# Patient Record
Sex: Female | Born: 1995 | Race: White | Hispanic: No | Marital: Single | Smoking: Never smoker
Health system: Southern US, Community
[De-identification: ages and names within clinical notes are randomized; demographics above are authoritative.]

## PROBLEM LIST (undated history)

## (undated) DIAGNOSIS — F329 Major depressive disorder, single episode, unspecified: Secondary | ICD-10-CM

## (undated) DIAGNOSIS — F419 Anxiety disorder, unspecified: Secondary | ICD-10-CM

## (undated) DIAGNOSIS — F32A Depression, unspecified: Secondary | ICD-10-CM

## (undated) HISTORY — DX: Anxiety disorder, unspecified: F41.9

## (undated) HISTORY — PX: NO PAST SURGERIES: SHX2092

## (undated) HISTORY — DX: Depression, unspecified: F32.A

## (undated) HISTORY — DX: Major depressive disorder, single episode, unspecified: F32.9

---
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
## 2013-02-10 ENCOUNTER — Ambulatory Visit (HOSPITAL_COMMUNITY): Payer: Self-pay | Admitting: Psychiatry

## 2013-02-11 ENCOUNTER — Encounter (HOSPITAL_COMMUNITY): Payer: Self-pay | Admitting: Psychiatry

## 2013-02-22 ENCOUNTER — Encounter (HOSPITAL_COMMUNITY): Payer: Self-pay

## 2013-02-22 ENCOUNTER — Inpatient Hospital Stay (HOSPITAL_COMMUNITY)
Admission: AD | Admit: 2013-02-22 | Discharge: 2013-02-22 | Disposition: A | Discharge status: Home / Self Care | Payer: Self-pay | Source: Ambulatory Visit | Attending: Obstetrics & Gynecology | Admitting: Obstetrics & Gynecology

## 2013-02-22 DIAGNOSIS — N949 Unspecified condition associated with female genital organs and menstrual cycle: Secondary | ICD-10-CM | POA: Insufficient documentation

## 2013-02-22 DIAGNOSIS — T192XXA Foreign body in vulva and vagina, initial encounter: Secondary | ICD-10-CM | POA: Insufficient documentation

## 2013-02-22 MED ORDER — IBUPROFEN 400 MG PO TABS
400.0000 mg | ORAL_TABLET | Freq: Once | ORAL | Status: AC
  Administered 2013-02-22: 400 mg via ORAL
  Filled 2013-02-22: qty 1

## 2013-02-22 MED ORDER — ALPRAZOLAM 0.25 MG PO TABS
0.2500 mg | ORAL_TABLET | Freq: Once | ORAL | Status: AC
  Administered 2013-02-22: 0.25 mg via ORAL
  Filled 2013-02-22: qty 1

## 2013-02-22 MED ORDER — LIDOCAINE HCL 2 % EX GEL
Freq: Once | CUTANEOUS | Status: DC
Start: 2013-02-22 — End: 2013-02-23
  Filled 2013-02-22: qty 20

## 2013-02-22 NOTE — MAU Note (Signed)
Pt refuses exam

## 2013-02-22 NOTE — MAU Note (Signed)
Denies pain, notes very foul odor. Pt's mother had her wait until menstrual cycle was over to come in to be evaluated.

## 2013-02-22 NOTE — MAU Provider Note (Signed)
History     CSN: [id number]  Arrival date and time: 02/22/13 1620   First Provider Initiated Contact with Patient 02/22/13 1833      Chief Complaint  Patient presents with  . Foreign Body in Vagina   HPI Jamie Castillo is 17 y.o. G0P0 presents with tampon in vagina with vaginal odor.  It has been there for 8 days.  She was under the impression that no one would remove it until her period was over.  She is not sexually active.    Past Medical History  Diagnosis Date  . Depression   . Anxiety     Past Surgical History  Procedure Laterality Date  . No past surgeries      Family History  Problem Relation Age of Onset  . Bipolar disorder Mother   . Asthma Brother   . Hypertension Other   . Depression Other   . Anxiety disorder Father   . Depression Maternal Uncle   . Depression Cousin     History  Substance Use Topics  . Smoking status: Never Smoker   . Smokeless tobacco: Never Used  . Alcohol Use: No    Allergies: No Known Allergies  Prescriptions prior to admission  Medication Sig Dispense Refill  . fexofenadine-pseudoephedrine (ALLEGRA-D 24) 180-240 MG per 24 hr tablet Take 1 tablet by mouth daily.  30 tablet  3  . lamoTRIgine (LAMICTAL) 25 MG tablet Take 1 tablet (25 mg total) by mouth daily.  30 tablet  5  . mirtazapine (REMERON) 15 MG tablet Take 1 tablet (15 mg total) by mouth at bedtime.  30 tablet  2    Review of Systems  Constitutional: Negative for fever and chills.  Genitourinary:       Vaginal odor   Physical Exam   Blood pressure 110/71, pulse 98, temperature 97.9 F (36.6 C), temperature source Oral, resp. rate 16, height 5\' 7"  (1.702 Castillo), weight 144 lb 12.8 oz (65.681 kg), last menstrual period 02/13/2013, SpO2 98.00%.  Physical Exam  Constitutional: She is oriented to person, place, and time. She appears well-developed and well-nourished.  HENT:  Head: Normocephalic.  Neck: Normal range of motion.  Respiratory: Effort normal.   Genitourinary:  Vaginal odor.  Tampon seen approximately 2 cm from the introitus.    Neurological: She is alert and oriented to person, place, and time.  Skin: Skin is warm and dry.  Psychiatric: She has a normal mood and affect. Her behavior is normal.    MAU Course  Procedures  MDM I placed the speculum gently into the vagina, opened it slowly.  Patient jumped, pulling her buttocks up into the bed.  The speculum came out.  Patient was upset, refusing another attempt.  I gave her time to regroup.  Had her Mother come in.  I explained the tampon was very close to the opening and that I would try to get it out with my fingers.  After a few minutes she agreed.  The attempt was unsuccessful because the patient would not leave her legs open.  She is tearful and very anxious.  Explained the importance of removing the tampon.   Suggested she go to the bathroom and try to remove it herself.   19:17  Patient's Mother is asking if the patient can have tylenol or ibuprofen  Ibuprofen 400mg  po ordered. 20:00 Discussed with Dr. Debroah Loop about adding an antianxiety med.  Order given for Xanex 0.25mg  po .  Will apply a small amount of  lidocaine 2% gel to opening to allow digital or instrumentation removal.   Care turned over to J. Vernie Ammons, PA. Assessment and Plan    Jamie Castillo 02/22/2013, 6:37 PM   2000 - Care assumed from [place], [place]  Procedure: Lidocaine gel applied to the external genitalia. Patient placed in the dorsal lithotomy position. Pediatric speculum used to visual the tampon. Tampon strings grasped with ring forcep and tampon removed. Small amount of thin, brown discharge noted from the vagina following removal. Approximately 35-40 minutes spent attempting to calm patient in order to use speculum to visualize tampon in the vagina. Patient is extremely anxious.   A: Retained tampon  P: Discharge home Toxic shock warning signs discussed and given on AVS Patient encouraged to follow-up with  PCP as needed Patient may return to MAU as needed or if her condition were to change or worsen  Jamie Starr, PA-C 02/22/2013 9:09 PM

## 2013-02-22 NOTE — MAU Note (Signed)
Patient states she has had a tampon in for 8 days. Has a bad vaginal odor.

## 2013-04-14 ENCOUNTER — Encounter: Payer: Self-pay | Admitting: Family Medicine

## 2013-04-14 ENCOUNTER — Ambulatory Visit (INDEPENDENT_AMBULATORY_CARE_PROVIDER_SITE_OTHER): Payer: Self-pay | Admitting: Family Medicine

## 2013-04-14 VITALS — BP 107/72 | HR 96 | Wt 146.0 lb

## 2013-04-14 DIAGNOSIS — F329 Major depressive disorder, single episode, unspecified: Secondary | ICD-10-CM

## 2013-04-14 MED ORDER — MIRTAZAPINE 15 MG PO TABS: 15.0000 mg | ORAL_TABLET | Freq: Every day | ORAL | Status: DC

## 2013-04-14 MED ORDER — LAMOTRIGINE 25 MG PO TABS: 25.0000 mg | ORAL_TABLET | Freq: Every day | ORAL | Status: DC

## 2013-04-14 NOTE — Progress Notes (Signed)
  Subjective:    Patient ID: Jamie Castillo, female    DOB: Apr 22, 1996, 17 y.o.   MRN: [id number]  HPI referral to mood center, she was seeing Dr. Christell Constant last seen 07/2012. mom stated that pt was dismissed due to [facility] she stated that when they would call to confirm her appts she accidentally pushed the wrong button which resulted in her being dismissed. She says she has been out of her meds for awhile. Would like new referral to psych as well and would like refills. She is doing well on current regime.  No therapy or counseling.  She is out of school now.  She is not wokring currently. Sleep is good right now.    Review of Systems     BP 107/72  Pulse 96  Wt 146 lb (66.225 kg)  BMI 22.86 kg/m2  LMP 04/10/2013    No Known Allergies  Past Medical History  Diagnosis Date  . Depression   . Anxiety     Past Surgical History  Procedure Laterality Date  . No past surgeries      History   Social History  . Marital Status: Single    Spouse Name: N/A    Number of Children: N/A  . Years of Education: N/A   Occupational History  . Not on file.   Social History Main Topics  . Smoking status: Never Smoker   . Smokeless tobacco: Never Used  . Alcohol Use: No  . Drug Use: No  . Sexual Activity: No   Other Topics Concern  . Not on file   Social History Narrative  . No narrative on file    Family History  Problem Relation Age of Onset  . Bipolar disorder Mother   . Asthma Brother   . Hypertension Other   . Depression Other   . Anxiety disorder Father   . Depression Maternal Uncle   . Depression Cousin     Outpatient Encounter Prescriptions as of 04/14/2013  Medication Sig Dispense Refill  . lamoTRIgine (LAMICTAL) 25 MG tablet Take 1 tablet (25 mg total) by mouth daily.  30 tablet  2  . mirtazapine (REMERON) 15 MG tablet Take 1 tablet (15 mg total) by mouth at bedtime.  30 tablet  2  . [DISCONTINUED] lamoTRIgine (LAMICTAL) 25 MG tablet Take 1 tablet (25 mg  total) by mouth daily.  30 tablet  5  . [DISCONTINUED] mirtazapine (REMERON) 15 MG tablet Take 1 tablet (15 mg total) by mouth at bedtime.  30 tablet  2   No facility-administered encounter medications on file as of 04/14/2013.       Objective:   Physical Exam  Constitutional: She is oriented to person, place, and time. She appears well-developed and well-nourished.  HENT:  Head: Normocephalic and atraumatic.  Cardiovascular: Normal rate, regular rhythm and normal heart sounds.   Pulmonary/Chest: Effort normal and breath sounds normal.  Neurological: She is alert and oriented to person, place, and time.  Skin: Skin is warm and dry.  Psychiatric: She has a normal mood and affect. Her behavior is normal.          Assessment & Plan:  MDD- will refill meds and refer to psych. Will refill med for 3 months. That should be enough time to see psych She is acutally really well controlled.  Sleep is well controlled.

## 2013-07-22 ENCOUNTER — Other Ambulatory Visit: Payer: Self-pay | Admitting: Family Medicine

## 2013-07-27 ENCOUNTER — Encounter: Payer: Self-pay | Admitting: Family Medicine

## 2013-07-27 ENCOUNTER — Telehealth: Payer: Self-pay | Admitting: *Deleted

## 2013-07-27 ENCOUNTER — Ambulatory Visit (INDEPENDENT_AMBULATORY_CARE_PROVIDER_SITE_OTHER): Payer: Self-pay | Admitting: Family Medicine

## 2013-07-27 VITALS — BP 114/77 | HR 81 | Temp 98.7°F | Wt 144.0 lb

## 2013-07-27 DIAGNOSIS — G4769 Other sleep related movement disorders: Secondary | ICD-10-CM

## 2013-07-27 DIAGNOSIS — G478 Other sleep disorders: Secondary | ICD-10-CM

## 2013-07-27 NOTE — Progress Notes (Signed)
CC: Jamie Castillo is a 17 y.o. female is here for couldnt move this am   Subjective: HPI:  Accompanied by mother  Patient reports awakening this morning   in a state where she was able to twitch her fingers however not able to move any other appendages. She was also able to breathe, talk, move her eyes, swallow but had no other function of her appendages.  She believes she was in this state for 45 minutes however admits she was not doing the clock during the majority of this time. Her mother was called to her bed and was able to prop the patient up in a sitting position.  Soon after this the patient was able to walk to the bathroom but does admit to subjective weakness in both legs and arms. She reports approximately an hour after awakening she was back to her normal state of health and currently has no complaints. She reports this has happened before however this only lasted no more than 1 minute she's unsure when the most recent episode was other than today. She denies any change to medications or skipping doses, she does admit that she has been under more stress than normal for her academics. She denies any mental disturbance, anxiety, nor depression. She reports she has been evaluated for seizures in the past with an EEG which was normal. There is a strong family history of psychiatric disease. Denies fevers, chills, confusion, coordination difficulty, amnesia, concentration difficulty.  Review Of Systems Outlined In HPI  Past Medical History  Diagnosis Date  . Depression   . Anxiety      Family History  Problem Relation Age of Onset  . Bipolar disorder Mother   . Asthma Brother   . Hypertension Other   . Depression Other   . Anxiety disorder Father   . Depression Maternal Uncle   . Depression Cousin      History  Substance Use Topics  . Smoking status: Never Smoker   . Smokeless tobacco: Never Used  . Alcohol Use: No     Objective: Filed Vitals:   07/27/13 1521   BP: 114/77  Pulse: 81  Temp: 98.7 F (37.1 C)    General: Alert and Oriented, No Acute Distress HEENT: Pupils equal, round, reactive to light. Conjunctivae clear.  External ears unremarkable, canals clear with intact TMs with appropriate landmarks.  Middle ear appears open without effusion. Pink inferior turbinates.  Moist mucous membranes, pharynx without inflammation nor lesions.  Neck supple without palpable lymphadenopathy nor abnormal masses. Neuro: CN II-XII grossly intact, full strength/rom of all four extremities, C5/L4/S1 DTRs 2/4 bilaterally, gait normal, rapid alternating movements normal, heel-shin test normal, Rhomberg normal. Lungs: Clear to auscultation bilaterally, no wheezing/ronchi/rales.  Comfortable work of breathing. Good air movement. Cardiac: Regular rate and rhythm. Normal S1/S2.  No murmurs, rubs, nor gallops.   Extremities: No peripheral edema.  Strong peripheral pulses.  Mental Status: No depression, anxiety, nor agitation. Skin: Warm and dry.  Assessment & Plan: [place] was seen today for couldnt move this am.  Diagnoses and associated orders for this visit:  Sleep paralysis    Discussed with patient and mother that her symptoms highly coincided with sleep paralysis and it does not currently sound like she is at risk for more serious neurologic disease. There is also the potential that this was due to a pseudoseizure however again no red flags warranting neuroimaging.  Reassurance was provided I've encouraged her to focus on a more structured sleep wake  cycle and to try work on alleviating stress in her life. Signs and symptoms requring emergent/urgent reevaluation were discussed with the patient.  Return if symptoms worsen or fail to improve.

## 2013-07-27 NOTE — Telephone Encounter (Signed)
Pt left a message on the triage line stating she woke up this am and could not move any part of her body except twitching her eyes. I called pt back asked her if she literally couldn't move any part of her body this am. Pt states that she could twitch her eyes but her body felt paralyzed. i asked pt if she has ever had a hx of seizures and she states she has not although she has been tested for this and her results were negative. I asked pt if she was in pain at all. Pt states she was not in pain. She denies any weakness one side of body,denies HA,blurred vision,or slurred speech.Pt does state she feels extremely tired now. I advised pt to go to the ER to be evaluated.

## 2013-10-15 ENCOUNTER — Other Ambulatory Visit: Payer: Self-pay | Admitting: Family Medicine

## 2014-05-03 ENCOUNTER — Ambulatory Visit (INDEPENDENT_AMBULATORY_CARE_PROVIDER_SITE_OTHER): Payer: Self-pay | Admitting: Family Medicine

## 2014-05-03 ENCOUNTER — Encounter: Payer: Self-pay | Admitting: Family Medicine

## 2014-05-03 VITALS — BP 105/73 | HR 67 | Temp 97.9°F | Wt 147.0 lb

## 2014-05-03 DIAGNOSIS — R040 Epistaxis: Secondary | ICD-10-CM

## 2014-05-03 NOTE — Progress Notes (Signed)
CC: Jamie Castillo is a 18 y.o. female is here for nosebleeds   Subjective: HPI:  Nosebleeds from the left nostril that has occurred 5 times since Sunday of this past weekend. Episodes are occurring unprovoked and are spontaneous, they resolve with 5-10 minutes of pressure and/or packing of the left nostril. She's had this issue in the past and has always been unprovoked. She's not been using anything in the nostril other than occasional packing. Denies nasal steroid sprays, placing foreign objects in the nose other than above. Denies bruising or bleeding abnormalities elsewhere.  She's not had an episode of yet today. There's been no racing heart, shortness of breath, nor history of surgical manipulation of the nostril or sinuses  Review Of Systems Outlined In HPI  Past Medical History  Diagnosis Date  . Depression   . Anxiety     Past Surgical History  Procedure Laterality Date  . No past surgeries     Family History  Problem Relation Age of Onset  . Bipolar disorder Mother   . Asthma Brother   . Hypertension Other   . Depression Other   . Anxiety disorder Father   . Depression Maternal Uncle   . Depression Cousin     History   Social History  . Marital Status: Single    Spouse Name: N/A    Number of Children: N/A  . Years of Education: N/A   Occupational History  . Not on file.   Social History Main Topics  . Smoking status: Never Smoker   . Smokeless tobacco: Never Used  . Alcohol Use: No  . Drug Use: No  . Sexual Activity: No   Other Topics Concern  . Not on file   Social History Narrative  . No narrative on file     Objective: BP 105/73  Pulse 67  Temp(Src) 97.9 F (36.6 C) (Oral)  Wt 147 lb (66.679 kg)  General: Alert and Oriented, No Acute Distress HEENT: Pupils equal, round, reactive to light. Conjunctivae clear.  External ears unremarkable, canals clear with intact TMs with appropriate landmarks.  Middle ear appears open without  effusion. Pink inferior turbinates a left anterior septum has a quarter centimeter diameter cluster of friable capillaries but no evidence of active bleeding.  Moist mucous membranes, pharynx without inflammation nor lesions.  Neck supple without palpable lymphadenopathy nor abnormal masses. Mental Status: No depression, anxiety, nor agitation. Skin: Warm and dry.  Assessment & Plan: Jamie Castillo was seen today for nosebleeds.  Diagnoses and associated orders for this visit:  Epistaxis    Epistaxis: Offered cauterization with silver nitrate today however she politely declines. We'll prevent return of epistaxis by using Ayr saline gel applied to the left medial nostril 4 times a day gently with a Q-tip if symptoms persist I've asked her to call me and I will refer her to ear nose and throat for consideration of cauterization. I've encouraged her to get a humidifier for her room and placed right next to the bed  Return if symptoms worsen or fail to improve.

## 2015-04-06 ENCOUNTER — Encounter: Payer: Self-pay | Admitting: Family Medicine

## 2015-04-06 ENCOUNTER — Ambulatory Visit (INDEPENDENT_AMBULATORY_CARE_PROVIDER_SITE_OTHER): Payer: Self-pay | Admitting: Family Medicine

## 2015-04-06 VITALS — BP 111/81 | HR 100 | Temp 97.9°F | Wt 141.0 lb

## 2015-04-06 DIAGNOSIS — B9789 Other viral agents as the cause of diseases classified elsewhere: Principal | ICD-10-CM

## 2015-04-06 DIAGNOSIS — J069 Acute upper respiratory infection, unspecified: Secondary | ICD-10-CM

## 2015-04-06 MED ORDER — PREDNISONE 10 MG PO TABS
30.0000 mg | ORAL_TABLET | Freq: Every day | ORAL | Status: DC
Start: 2015-04-06 — End: 2015-11-23

## 2015-04-06 MED ORDER — IPRATROPIUM BROMIDE 0.06 % NA SOLN: 2.0000 | Freq: Four times a day (QID) | NASAL | Status: DC

## 2015-04-06 NOTE — Assessment & Plan Note (Signed)
Viral URI. Symptomatic management with Tylenol ibuprofen Atrovent nasal spray and prednisone as needed. Return as needed if not feeling better.

## 2015-04-06 NOTE — Patient Instructions (Signed)
Thank you for coming in today. Take 2 extra strength Tylenol every 6 hours or 3-4 regular ibuprofen every 8 hours for pain or fevers or chills Use Atrovent nasal spray as needed for runny or stuffy nose. If not getting better take prednisone for 5 days. Call or go to the emergency room if you get worse, have trouble breathing, have chest pains, or palpitations.   Upper Respiratory Infection, Adult An upper respiratory infection (URI) is also sometimes known as the common cold. The upper respiratory tract includes the nose, sinuses, throat, trachea, and bronchi. Bronchi are the airways leading to the lungs. Most people improve within 1 week, but symptoms can last up to 2 weeks. A residual cough may last even longer.  CAUSES Many different viruses can infect the tissues lining the upper respiratory tract. The tissues become irritated and inflamed and often become very moist. Mucus production is also common. A cold is contagious. You can easily spread the virus to others by oral contact. This includes kissing, sharing a glass, coughing, or sneezing. Touching your mouth or nose and then touching a surface, which is then touched by another person, can also spread the virus. SYMPTOMS  Symptoms typically develop 1 to 3 days after you come in contact with a cold virus. Symptoms vary from person to person. They may include:  Runny nose.  Sneezing.  Nasal congestion.  Sinus irritation.  Sore throat.  Loss of voice (laryngitis).  Cough.  Fatigue.  Muscle aches.  Loss of appetite.  Headache.  Low-grade fever. DIAGNOSIS  You might diagnose your own cold based on familiar symptoms, since most people get a cold 2 to 3 times a year. Your caregiver can confirm this based on your exam. Most importantly, your caregiver can check that your symptoms are not due to another disease such as strep throat, sinusitis, pneumonia, asthma, or epiglottitis. Blood tests, throat tests, and X-rays are not  necessary to diagnose a common cold, but they may sometimes be helpful in excluding other more serious diseases. Your caregiver will decide if any further tests are required. RISKS AND COMPLICATIONS  You may be at risk for a more severe case of the common cold if you smoke cigarettes, have chronic heart disease (such as heart failure) or lung disease (such as asthma), or if you have a weakened immune system. The very young and very old are also at risk for more serious infections. Bacterial sinusitis, middle ear infections, and bacterial pneumonia can complicate the common cold. The common cold can worsen asthma and chronic obstructive pulmonary disease (COPD). Sometimes, these complications can require emergency medical care and may be life-threatening. PREVENTION  The best way to protect against getting a cold is to practice good hygiene. Avoid oral or hand contact with people with cold symptoms. Wash your hands often if contact occurs. There is no clear evidence that vitamin C, vitamin E, echinacea, or exercise reduces the chance of developing a cold. However, it is always recommended to get plenty of rest and practice good nutrition. TREATMENT  Treatment is directed at relieving symptoms. There is no cure. Antibiotics are not effective, because the infection is caused by a virus, not by bacteria. Treatment may include:  Increased fluid intake. Sports drinks offer valuable electrolytes, sugars, and fluids.  Breathing heated mist or steam (vaporizer or shower).  Eating chicken soup or other clear broths, and maintaining good nutrition.  Getting plenty of rest.  Using gargles or lozenges for comfort.  Controlling fevers with ibuprofen  or acetaminophen as directed by your caregiver.  Increasing usage of your inhaler if you have asthma. Zinc gel and zinc lozenges, taken in the first 24 hours of the common cold, can shorten the duration and lessen the severity of symptoms. Pain medicines may help  with fever, muscle aches, and throat pain. A variety of non-prescription medicines are available to treat congestion and runny nose. Your caregiver can make recommendations and may suggest nasal or lung inhalers for other symptoms.  HOME CARE INSTRUCTIONS   Only take over-the-counter or prescription medicines for pain, discomfort, or fever as directed by your caregiver.  Use a warm mist humidifier or inhale steam from a shower to increase air moisture. This may keep secretions moist and make it easier to breathe.  Drink enough water and fluids to keep your urine clear or pale yellow.  Rest as needed.  Return to work when your temperature has returned to normal or as your caregiver advises. You may need to stay home longer to avoid infecting others. You can also use a face mask and careful hand washing to prevent spread of the virus. SEEK MEDICAL CARE IF:   After the first few days, you feel you are getting worse rather than better.  You need your caregiver's advice about medicines to control symptoms.  You develop chills, worsening shortness of breath, or brown or red sputum. These may be signs of pneumonia.  You develop yellow or brown nasal discharge or pain in the face, especially when you bend forward. These may be signs of sinusitis.  You develop a fever, swollen neck glands, pain with swallowing, or white areas in the back of your throat. These may be signs of strep throat. SEEK IMMEDIATE MEDICAL CARE IF:   You have a fever.  You develop severe or persistent headache, ear pain, sinus pain, or chest pain.  You develop wheezing, a prolonged cough, cough up blood, or have a change in your usual mucus (if you have chronic lung disease).  You develop sore muscles or a stiff neck. Document Released: 01/22/2001 Document Revised: 10/21/2011 Document Reviewed: 11/03/2013 [facility] Patient Information 2015 [employer] [place]. This information is not intended to replace advice given to you by  your health care provider. Make sure you discuss any questions you have with your health care provider.

## 2015-04-06 NOTE — Progress Notes (Signed)
Jamie Castillo is a 19 y.o. female who presents to [facility]  today for sore throat, congestion and runny nose. Symptoms present for 2 days. She has not tried any medications. No fevers chills nausea vomiting diarrhea chest pains or palpitations.   Past Medical History  Diagnosis Date  . Depression   . Anxiety    Past Surgical History  Procedure Laterality Date  . No past surgeries     Social History  Substance Use Topics  . Smoking status: Never Smoker   . Smokeless tobacco: Never Used  . Alcohol Use: No   family history includes Anxiety disorder in her father; Asthma in her brother; Bipolar disorder in her mother; Depression in her cousin, maternal uncle, and other; Hypertension in her other.  ROS as above Medications: Current Outpatient Prescriptions  Medication Sig Dispense Refill  . ipratropium (ATROVENT) 0.06 % nasal spray Place 2 sprays into both nostrils 4 (four) times daily. 15 mL 1  . predniSONE (DELTASONE) 10 MG tablet Take 3 tablets (30 mg total) by mouth daily. 15 tablet 0   No current facility-administered medications for this visit.   No Known Allergies   Exam:  BP 111/81 mmHg  Pulse 100  Temp(Src) 97.9 F (36.6 C) (Oral)  Wt 141 lb (63.957 kg)  SpO2 95% Gen: Well NAD HEENT: EOMI,  MMM posterior pharynx with cobblestoning. Clear nasal discharge. Normal tympanic membranes bilaterally. Lungs: Normal work of breathing. CTABL Heart: RRR no MRG Abd: NABS, Soft. Nondistended, Nontender Exts: Brisk capillary refill, warm and well perfused.   No results found for this or any previous visit (from the past 24 hour(s)). No results found.   Please see individual assessment and plan sections.

## 2015-04-07 ENCOUNTER — Ambulatory Visit: Payer: Self-pay | Admitting: Family Medicine

## 2015-11-23 ENCOUNTER — Encounter: Payer: Self-pay | Admitting: Osteopathic Medicine

## 2015-11-23 ENCOUNTER — Ambulatory Visit (INDEPENDENT_AMBULATORY_CARE_PROVIDER_SITE_OTHER): Payer: Managed Care, Other (non HMO) | Admitting: Osteopathic Medicine

## 2015-11-23 VITALS — BP 99/75 | HR 83 | Temp 98.4°F | Wt 134.0 lb

## 2015-11-23 DIAGNOSIS — J029 Acute pharyngitis, unspecified: Secondary | ICD-10-CM

## 2015-11-23 DIAGNOSIS — J069 Acute upper respiratory infection, unspecified: Secondary | ICD-10-CM | POA: Diagnosis not present

## 2015-11-23 DIAGNOSIS — B9789 Other viral agents as the cause of diseases classified elsewhere: Secondary | ICD-10-CM

## 2015-11-23 LAB — POCT RAPID STREP A (OFFICE): RAPID STREP A SCREEN: NEGATIVE

## 2015-11-23 MED ORDER — LIDOCAINE VISCOUS 2 % MT SOLN: 15.0000 mL | OROMUCOSAL | Status: DC | PRN

## 2015-11-23 MED ORDER — BENZONATATE 200 MG PO CAPS: 200.0000 mg | ORAL_CAPSULE | Freq: Three times a day (TID) | ORAL | Status: DC | PRN

## 2015-11-23 MED ORDER — METHYLPREDNISOLONE 4 MG PO TBPK: ORAL_TABLET | ORAL | Status: DC

## 2015-11-23 MED ORDER — IPRATROPIUM BROMIDE 0.03 % NA SOLN: 2.0000 | Freq: Three times a day (TID) | NASAL | Status: DC

## 2015-11-23 NOTE — Progress Notes (Signed)
HPI: Jamie Castillo is a 20 y.o. female who [facility] [place]  [facility] for chief complaint of:  Chief Complaint  Patient presents with  . Sore Throat   SORE THROAT . Location: THROAT  . Quality: SORE, SHARP PAIN . Assoc signs/symptoms: see ROS . Duration: 1 days . Modifying factors: has tried the following OTC/Rx medications: IBUPROFEN/TYLENOL    Past medical, social and family history reviewed. Current medications and allergies reviewed.     Review of Systems: CONSTITUTIONAL: no fever/chills HEAD/EYES/EARS/NOSE/THROAT: yes headache, no vision change or hearing change, yes sore throat CARDIAC: No chest pain/pressure/palpitations RESPIRATORY: yes cough, no shortness of breath GASTROINTESTINAL: no nausea, no vomiting, no abdominal pain, no diarrhea MUSCULOSKELETAL: yes myalgia/arthralgia   Exam:  BP 99/75 mmHg  Pulse 83  Temp(Src) 98.4 F (36.9 C) (Oral)  Wt 134 lb (60.782 kg) Constitutional: VSS, see above. General Appearance: alert, well-developed, well-nourished, NAD Eyes: Normal lids and conjunctive, non-icteric sclera, PERRLA Ears, Nose, Mouth, Throat: Normal external inspection ears/nares/mouth/lips/gums, normal TM, MMM;       posterior pharynx with erythema, without exudate Neck: No masses, trachea midline. normal lymph nodes Respiratory: Normal respiratory effort. No  wheeze/rhonchi/rales Cardiovascular: S1/S2 normal, no murmur/rub/gallop auscultated. RRR.  MODIFIED CENTOR CRITERIA (ponts if "yes"): Tonsillar exudate (1): no Tender Ant Cervical LN (1): no Absence of cough (1): no Fever (1): no Age  59-14 (1): no 15-45 (0): yes >/= 45 (-1): no SCORE: 0   Results for orders placed or performed in visit on 11/23/15 (from the past 72 hour(s))  POCT rapid strep A     Status: Abnormal   Collection Time: 11/23/15  3:27 PM  Result Value Ref Range   Rapid Strep A Screen Negative Negative   No results  found.   ASSESSMENT/PLAN:  Acute pharyngitis, unspecified etiology - Plan: POCT rapid strep A, lidocaine (XYLOCAINE) 2 % solution  Viral URI with cough - Plan: methylPREDNISolone (MEDROL DOSEPAK) 4 MG TBPK tablet, benzonatate (TESSALON) 200 MG capsule, ipratropium (ATROVENT) 0.03 % nasal spray    Return if symptoms worsen or fail to improve.

## 2015-11-23 NOTE — Patient Instructions (Signed)
DR. Inara Dike'S HOME CARE INSTRUCTIONS: VIRAL ILLNESSES - ACHES/PAINS, SORE THROAT, SINUSITIS, COUGH, GASTRITIS   FRIST, A FEW NOTES ON OVER-THE-COUNTER MEDICATIONS!   USE CAUTION - MANY OVER-THE-COUNTER MEDICATIONS COME IN COMBINATIONS OF MULTIPLE GENERIC MEDICINES. FOR INSTANCE, NYQUIL HAS TYLENOL + COUGH MEDICINE + AN ANTIHISTAMINE, SO BE CAREFUL YOU'RE NOT TAKING A COMBINATION MEDICINE WHICH CONTAINS MEDICATIONS YOU'RE ALSO TAKING SEPARATELY (LIKE [facility]QUIL SYRUP PLUS TYLENOL PILLS).   YOUR PHARMACIST CAN HELP YOU AVOID MEDICATION INTERACTIONS AND DUPLICATIONS - ASK FOR THEIR HELP IF YOU ARE CONFUSED OR UNSURE ABOUT WHAT TO PURCHASE OVER-THE-COUNTER!   REMEMBER - IF YOU'RE EVER CONCERNED ABOUT MEDICATION SIDE EFFECTS, OR IF YOU'RE EVER CONCERNED YOUR SYMPTOMS ARE GETTING WORSE DESPITE TREATMENT, PLEASE CALL THE DOCTOR'S OFFICE! IF AFTER-HOURS, YOU CAN BE SEEN IN URGENT CARE OR, FOR SEVERE ILLNESS, PLEASE GO TO THE EMERGENCY ROOM.   TREAT SINUS CONGESTION, RUNNY NOSE & POSTNASAL DRIP:  Treat to increase airflow through sinuses, decrease congestion pain and prevent bacterial growth!   Remember, only 0.5-2% of sinus infections are due to a bacteria, the rest are due to a virus (usually the common cold) which will not get better with antibiotics!  NASAL SPRAYS: generally safe and should not interact with other medicines. Can take any of these medications, either alone or together... FLONASE (FLUTICASONE) - 2 sprays in each nostril twice per day (also a great allergy medicine to use long-term!) AFRIN (OXYMETOLAZONE) - use sparingly because it will cause rebound congestion, NEVER USE IN KIDS  SALINE NASAL SPRAY- no limit, but avoid use after other nasal sprays or it can wash the medicine away PRESCRTIPTION ATROVENT - as directed on prescription bottle  ANTIHISTAMINES: Helps dry out runny nose and decreases postnasal drip. Benadryl may cause drowsiness but other preparations should not be  as sedating. Certain kinds are not as safe in elderly individuals. OK to use unless Dr A says otherwise.  Only use one of the following... BENADRYL (DIPHENHYDRAMINE) - 25-50 mg every 6 hours ZYRTEC (CETIRIZINE) - 5-10 mg daily CLARITIN (LORATIDINE) - less potent. 10 mg daily ALLEGRA (FEXOFENADINE) - least likely to cause drowsiness! 180 mg daily or 60 mg twice per day  DECONGESTANTS: Helps dry out runny nose and helps with sinus pain. May cause insomnia, or sometimes elevated heart rate. Can cause problems if used often in people with high blood pressure. OK to use unless Dr A says otherwise. NEVER USE IN KIDS UNDER 2 YEARS OLD. Only use one of the following... SUDAFED (PSEUDOEPHEDINE) - 60 mg every 4 - 6 hours, also comes in 120 mg extended release every 12 hours, maximum 240 mg in 24 hours.  SUDAED PE (PHENYLEPHRINE) - 10 mg every 4 - 6 hours, maximum 60 mg per day  COMBINATIONS OF ABOVE ANTIHISTAMINES + DECONGESTANTS: these usually require you to show your ID at the pharmacy counter. You can also purchase these medicines separately as noted above.  Only use one of the following... ZYRTEC-D (CETIRIZINE + PSEUDOEPHEDRINE) - 12 hour formulation as directed CLARITIN-D (LORATIDINE + PSEUDOEPHEDRINE) - 12 and 24 hour formulations as directed ALLEGRA-D (FEXOFENADINE + PSEUDOEPHEDRINE) - 12 and 24 hour formulations as directed  PRESCRIPTION TREATMENT FOR SINUS PROBLEMS: Can include nasal sprays, pills, or antibiotics in the case of true bacterial infection. Not everyone needs an antibiotic but there are other medicines which will help you feel better while your body fights the infection!    TREAT COUGH & SORE THROAT:  Remember, cough is the body's way of protecting your   airways and lungs, it's a hard-wired reflex that is tough for medicines to treat!   Irritation to the airways will cause cough. This irritation is usually caused by upper airway problems like postnasal drip (treat as above)  and viral sore throat, but in severe cases can be due to lower airway problems like bronchitis or pneumonia, which a doctor can usually hear on exam of your lungs or see on an X-ray.  Sore throat is almost always due to a virus, but occasionally caused by certain strains of Strep, which requires antibiotics.   Exercise and smoking may make cough worse - take it easy while you're sick, and QUIT SMOKING!   Cough due to viral infection can linger for 2-3 weeks or so. If you're coughing longer than you think you should, or if the cough is severe, please make an appointment in the office - you may need a chest X-ray.  EXPECTORANT: Used to help clear airways, take these with PLENTY of water to help thin mucus secretions and make the mucus easier to cough up  Only use one of the following... ROBITUSSIN (DEXTROMETHORPHAN OR GUAIFENISEN depending on formulation)  MUCINEX (GUAIFENICEN) - usually longer acting  COUGH DROPS/LOZENGES: Whichever over-the-counter agent you prefer!  Here are some suggestions for ingredients to look for (can take both)... BENZOCAINE - numbing effect, also in CHLORASEPTIC THROAT SPRAY MENTHOL - cooling effect  HONEY: has gone head-to-head in several clinical trials with prescription cough medicines and found to be equally effective! Try 1 Teaspoon Honey + 2 Drops Lemon Juice, as much as you want to use. NONE FOR KIDS UNDER AGE 2  HERBAL TEA: There are certain ingredients which help "coat the throat" to relieve pain  such as ELM BARK, LICORICE ROOT, MARSHMALLOW ROOT  PRESCRIPTION TREATMENT FOR COUGH: Reserved for severe cases. Can include pills, syrups or inhalers.    TREAT ACHES & PAINS, FEVER:  Illness causes aches, pains and fever as your body increases its natural inflammation response to help fight the infection.   Rest, good hydration and nutrition, and taking anti-inflammatory medications will help.   Remember: a true fever is a temperature 100.4 or  higher. If you have a fever that is 105.0 or higher, that is a dangerous level and needs medical attention in the office or in the ER!  Can take both of these together if it's ok with your doctor... IBUPROFEN - 400-800 mg every 6 - 8 hours. Ibuprofen and similar medications can cause problems for people with heart disease or history of stomach ulcers, check with Dr A first if you're concerned. Lower doses are usually safe and effective.  TYLENOL (ACETAMINOPHEN) - 500-1000 mg every 6 hours. It won't cause problems with heart or stomach.     TREAT GASTROINTESTINAL SYMPTOMS:  Hydrate, hydrate, hydrate! Try drinking Gatorade/Powerade, broth/soup. Avoid milk and juice, these can make diarrhea worse. You can drink water, of course, but if you are also having vomiting and loose stool you are losing electrolytes which water alone can't replace.  IMMODIUM (LOPERAMIDE) - as directed on the bottle to help with loose stool PRESCRIPTION ZOFRAN (ONDANSETRON) OR PHENERGAN (PROMETHAZINE) - as directed to help nausea and vomiting. Try taking these before you eat if you are having trouble keeping food down.     REMEMBER - THE MOST IMPORTANT THINGS YOU CAN DO TO AVOID CATCHING OR SPREADING ILLNESS INCLUDE:   COVER YOUR COUGH WITH YOUR ARM, NOT WITH YOUR HANDS!   DISINFECT COMMONLY USED SURFACES (SUCH AS   TELEPHONES & DOORKNOBS) WHEN YOU OR SOMEONE CLOSE TO YOU IS FEELING SICK!   BE SURE VACCINES ARE UP TO DATE - GET A FLU SHOT EVERY YEAR! THE FLU CAN BE DEADLY FOR BABIES, ELDERLY FOLKS, AND PEOPLE WITH WEAK IMMUNE SYSTEMS - YOU SHOULD BE VACCINATED TO HELP PREVENT YOURSELF FROM GETTING SICK, BUT ALSO TO PREVENT SOMEONE ELSE FROM GETTING AN INFECTION WHICH MAY HOSPITALIZE OR KILL THEM.  GOOD NUTRITION AND HEALTHY LIFESTYLE WILL HELP YOUR IMMUNE SYSTEM YEAR-ROUND! THERE IS NO MAGIC SUPPLEMENT!  AND ABOVE ALL - WASH YOUR HANDS OFTEN!   

## 2016-06-04 ENCOUNTER — Telehealth: Payer: Self-pay | Admitting: *Deleted

## 2016-06-04 ENCOUNTER — Emergency Department
Admission: EM | Admit: 2016-06-04 | Discharge: 2016-06-04 | Disposition: A | Discharge status: Home / Self Care | Payer: Managed Care, Other (non HMO) | Source: Home / Self Care | Attending: Emergency Medicine | Admitting: Emergency Medicine

## 2016-06-04 ENCOUNTER — Encounter: Payer: Self-pay | Admitting: *Deleted

## 2016-06-04 DIAGNOSIS — J028 Acute pharyngitis due to other specified organisms: Secondary | ICD-10-CM | POA: Diagnosis not present

## 2016-06-04 LAB — POCT RAPID STREP A (OFFICE): Rapid Strep A Screen: NEGATIVE

## 2016-06-04 MED ORDER — METHYLPREDNISOLONE SODIUM SUCC 125 MG IJ SOLR: 125.0000 mg | Freq: Once | INTRAMUSCULAR | Status: DC

## 2016-06-04 NOTE — ED Provider Notes (Signed)
[facility]    CSN: [id number] Arrival date & time: 06/04/16  1401     History   Chief Complaint Chief Complaint  Patient presents with  . Sore Throat    HPI [place] Jerl MinaSkye Bodkin is a 20 y.o. female.   HPI Patient is a 20 y.o. female who presents to UC c/o sore throat, fatigue, nonproductive cough, and HA x 1 day.  She states that her throat pain is a 5/10. +Mild Headache.  States that her boyfriend was sick with similar symptoms 2 days ago. Reports history of mild allergies with changing of seasons. Has had two nose bleeds today, one this morning and one while at work.--Nosebleeds have since resolved.  She does have a history of nose bleeds in the past, usually 2-3x per year. Denies any exposure to kids or any one with mono or strep that she knows of.  Reports frequent history of strep in the past.  Denies fever, chills, abdominal pain, N/V, diarrhea, SOB, CP, urinary symptoms.   Past Medical History:  Diagnosis Date  . Anxiety   . Depression     Patient Active Problem List   Diagnosis Date Noted  . Viral URI with cough 04/06/2015  . INSOMNIA 06/19/2010  . MDD (major depressive disorder) 12/20/2009  . 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 NO PAST SURGERIES      OB History    Gravida Para Term Preterm AB Living   0             SAB TAB Ectopic Multiple Live Births                   Home Medications    Prior to Admission medications   Not on File    Family History Family History  Problem Relation Age of Onset  . Bipolar disorder Mother   . Hypertension Other   . Depression Other   . Anxiety disorder Father   . Depression Maternal Uncle   . Depression Cousin   . Asthma Brother     Social History Social History  Substance Use Topics  . Smoking status: Never Smoker  . Smokeless tobacco: Never Used  . Alcohol use Yes     Allergies   Review of patient's  allergies indicates no known allergies.   Review of Systems Review of Systems  All other systems reviewed and are negative.     Physical Exam Triage Vital Signs ED Triage Vitals  Enc Vitals Group     BP 06/04/16 1456 100/64     Pulse Rate 06/04/16 1456 78     Resp 06/04/16 1456 16     Temp 06/04/16 1456 98.1 F (36.7 C)     Temp Source 06/04/16 1456 Oral     SpO2 06/04/16 1456 99 %     Weight 06/04/16 1457 137 lb (62.1 kg)     Height 06/04/16 1457 5\' 7"  (1.702 m)     Head Circumference --      Peak Flow --      Pain Score 06/04/16 1458 0     Pain Loc --      Pain Edu? --      Excl. in GC? --    No data found.   Updated Vital Signs BP 100/64 (BP Location: Left Arm)   Pulse 78   Temp 98.1 F (36.7 C) (Oral)   Resp 16  Ht 5\' 7"  (1.702 m)   Wt 137 lb (62.1 kg)   LMP 05/28/2016   SpO2 99%   BMI 21.46 kg/m   Visual Acuity Right Eye Distance:   Left Eye Distance:   Bilateral Distance:    Right Eye Near:   Left Eye Near:    Bilateral Near:     Physical Exam  Constitutional: She is oriented to person, place, and time. She appears well-developed and well-nourished.  HENT:  Head: Normocephalic and atraumatic.  Right Ear: Hearing, tympanic membrane, external ear and ear canal normal.  Left Ear: Hearing, tympanic membrane, external ear and ear canal normal.  Nose: Nose normal.  Mouth/Throat: Uvula is midline and mucous membranes are normal. No oral lesions. No dental abscesses or uvula swelling. Posterior oropharyngeal erythema (mild) present. No oropharyngeal exudate, posterior oropharyngeal edema or tonsillar abscesses. Tonsils are 1+ on the right. Tonsils are 1+ on the left. No tonsillar exudate.  Eyes: Conjunctivae and EOM are normal. Right eye exhibits no discharge. Left eye exhibits no discharge.  Neck: Normal range of motion. Neck supple. No tracheal deviation present.  Cardiovascular: Normal rate, regular rhythm and normal heart sounds.   Pulmonary/Chest:  Effort normal and breath sounds normal. No respiratory distress. She has no wheezes. She exhibits no tenderness.  Abdominal: Soft. There is no tenderness.  Musculoskeletal: Normal range of motion.  Lymphadenopathy:    She has no cervical adenopathy.  Neurological: She is alert and oriented to person, place, and time.  Skin: Skin is warm and dry. No rash noted. No erythema.  Psychiatric: She has a normal mood and affect. Her behavior is normal.  Nursing note and vitals reviewed.    UC Treatments / Results  Labs (all labs ordered are listed, but only abnormal results are displayed) Labs Reviewed  STREP A DNA PROBE  POCT RAPID STREP A (OFFICE)    EKG  EKG Interpretation None       Radiology No results found.  Procedures Procedures (including critical care time)  Medications Ordered in UC Medications - No data to display   Initial Impression / Assessment and Plan / UC Course  I have reviewed the triage vital signs and the nursing notes.  Pertinent labs & imaging results that were available during my care of the patient were reviewed by me and considered in my medical decision making (see chart for details).  Clinical Course   Rapid strep performed: Negative  Treatment options discussed, as well as risks, benefits, alternatives. Patient voiced understanding and agreement with the following plans:  Because of only 1 day of symptoms and no fever, it is very unlikely that this is bacterial cause for sore throat. have decided to wait on prescribing antibiotics.  Recommend symptomatic treatment with nasal saline spray for nose bleeds, tylenol/motrin for sore throat and HA, rest and warm tea with honey and lemon.  Strep culture sent off  Follow-up with your primary care doctor in 5-7 days if not improving, or sooner if symptoms become worse. Precautions discussed. Red flags discussed. Questions invited and answered. Patient voiced understanding and  agreement.   Final Clinical Impressions(s) / UC Diagnoses   Final diagnoses:  Pharyngitis due to other organism    New Prescriptions There are no discharge medications for this patient.    Lajean Manes, MD 06/04/16 [phone]

## 2016-06-04 NOTE — Telephone Encounter (Signed)
Encounter created to enter Strep culture Order and result not entered on DOS.

## 2016-06-04 NOTE — ED Triage Notes (Signed)
Pt c/o sore throat, nonproductive cough, and HA x 1 day. Denies fever.

## 2016-06-05 ENCOUNTER — Ambulatory Visit: Payer: Self-pay | Admitting: Sports Medicine

## 2016-06-20 NOTE — Telephone Encounter (Signed)
Strep a DNA probe came back negative

## 2016-08-08 ENCOUNTER — Encounter: Payer: Self-pay | Admitting: *Deleted

## 2016-08-08 ENCOUNTER — Emergency Department (INDEPENDENT_AMBULATORY_CARE_PROVIDER_SITE_OTHER)
Admission: EM | Admit: 2016-08-08 | Discharge: 2016-08-08 | Disposition: A | Discharge status: Home / Self Care | Payer: Self-pay | Source: Home / Self Care | Attending: Emergency Medicine | Admitting: Emergency Medicine

## 2016-08-08 ENCOUNTER — Emergency Department (INDEPENDENT_AMBULATORY_CARE_PROVIDER_SITE_OTHER): Payer: Managed Care, Other (non HMO)

## 2016-08-08 DIAGNOSIS — R079 Chest pain, unspecified: Secondary | ICD-10-CM

## 2016-08-08 DIAGNOSIS — M25562 Pain in left knee: Secondary | ICD-10-CM

## 2016-08-08 DIAGNOSIS — S8002XA Contusion of left knee, initial encounter: Secondary | ICD-10-CM

## 2016-08-08 DIAGNOSIS — S8001XA Contusion of right knee, initial encounter: Secondary | ICD-10-CM

## 2016-08-08 DIAGNOSIS — M25561 Pain in right knee: Secondary | ICD-10-CM

## 2016-08-08 DIAGNOSIS — S39012A Strain of muscle, fascia and tendon of lower back, initial encounter: Secondary | ICD-10-CM

## 2016-08-08 DIAGNOSIS — M545 Low back pain, unspecified: Secondary | ICD-10-CM

## 2016-08-08 DIAGNOSIS — M7918 Myalgia, other site: Secondary | ICD-10-CM

## 2016-08-08 MED ORDER — MELOXICAM 7.5 MG PO TABS: ORAL_TABLET | ORAL | 0 refills | Status: DC

## 2016-08-08 MED ORDER — CYCLOBENZAPRINE HCL 5 MG PO TABS: 5.0000 mg | ORAL_TABLET | Freq: Every day | ORAL | 0 refills | Status: DC
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
## 2016-08-14 ENCOUNTER — Ambulatory Visit (INDEPENDENT_AMBULATORY_CARE_PROVIDER_SITE_OTHER): Payer: Managed Care, Other (non HMO) | Admitting: Family Medicine

## 2016-08-14 ENCOUNTER — Other Ambulatory Visit: Payer: Self-pay

## 2016-08-14 ENCOUNTER — Encounter: Payer: Self-pay | Admitting: Family Medicine

## 2016-08-14 VITALS — BP 96/65 | HR 124 | Temp 99.9°F | Wt 135.0 lb

## 2016-08-14 DIAGNOSIS — S39012A Strain of muscle, fascia and tendon of lower back, initial encounter: Secondary | ICD-10-CM | POA: Diagnosis not present

## 2016-08-14 DIAGNOSIS — J039 Acute tonsillitis, unspecified: Secondary | ICD-10-CM

## 2016-08-14 MED ORDER — CEFDINIR 300 MG PO CAPS: 300.0000 mg | ORAL_CAPSULE | Freq: Two times a day (BID) | ORAL | 0 refills | Status: DC

## 2016-08-14 NOTE — Patient Instructions (Signed)
Thank you for coming in today. Return as needed.  Use omnicef twice daily for tonsillitis.   Attend PT as needed.    Tonsillitis Tonsillitis is an infection of the throat. This infection causes the tonsils to become red, tender, and puffy (swollen). Tonsils are groups of tissue at the back of your throat. If bacteria caused your infection, antibiotic medicine will be given to you. Sometimes symptoms of tonsillitis can be relieved with the use of steroid medicine. If your tonsillitis is severe and happens often, you may need to get your tonsils removed (tonsillectomy). Follow these instructions at home:  Rest and sleep often.  Drink enough fluids to keep your pee (urine) clear or pale yellow.  While your throat is sore, eat soft or liquid foods like:  Soup.  Ice cream.  Instant breakfast drinks.  Eat frozen ice pops.  Gargle with a warm or cold liquid to help soothe the throat. Gargle with a water and salt mix. Mix 1/4 teaspoon of salt and 1/4 teaspoon of baking soda in 1 cup of water.  Only take medicines as told by your doctor.  If you are given medicines (antibiotics), take them as told. Finish them even if you start to feel better. Contact a doctor if:  You have large, tender lumps in your neck.  You have a rash.  You cough up green, yellow-brown, or bloody fluid.  You cannot swallow liquids or food for 24 hours.  You notice that only one of your tonsils is swollen. Get help right away if:  You throw up (vomit).  You have a very bad headache.  You have a stiff neck.  You have chest pain.  You have trouble breathing or swallowing.  You have bad throat pain, drooling, or your voice changes.  You have bad pain not helped by medicine.  You cannot fully open your mouth.  You have redness, puffiness, or bad pain in the neck.  You have a fever. This information is not intended to replace advice given to you by your health care provider. Make sure you discuss  any questions you have with your health care provider. Document Released: 01/15/2008 Document Revised: 01/04/2016 Document Reviewed: 01/15/2013 Elsevier Interactive Patient Education  2017 [employer]    Lumbosacral Strain Lumbosacral strain is an injury that causes pain in the lower back (lumbosacral spine). This injury usually occurs from overstretching the muscles or ligaments along your spine. A strain can affect one or more muscles or cord-like tissues that connect bones to other bones (ligaments). What are the causes? This condition may be caused by:  A hard, direct hit (blow) to the back.  Excessive stretching of the lower back muscles. This may result from:  A fall.  Lifting something heavy.  Repetitive movements such as bending or crouching. What increases the risk? The following factors may increase your risk of getting this condition:  Participating in sports or activities that involve:  A sudden twist of the back.  Pushing or pulling motions.  Being overweight or obese.  Having poor strength and flexibility, especially tight hamstrings or weak muscles in the back or abdomen.  Having too much of a curve in the lower back.  Having a pelvis that is tilted forward. What are the signs or symptoms? The main symptom of this condition is pain in the lower back, at the site of the strain. Pain may extend (radiate) down one or both legs. How is this diagnosed? This condition is diagnosed based on:  Your symptoms.  Your medical history.  A physical exam.  Your health care provider may push on certain areas of your back to determine the source of your pain.  You may be asked to bend forward, backward, and side to side to assess the severity of your pain and your range of motion.  Imaging tests, such as:  X-rays.  MRI. How is this treated? Treatment for this condition may include:  Putting heat and cold on the affected area.  Medicines to help relieve  pain and relax your muscles (muscle relaxants).  NSAIDs to help reduce swelling and discomfort. When your symptoms improve, it is important to gradually return to your normal routine as soon as possible to reduce pain, avoid stiffness, and avoid loss of muscle strength. Generally, symptoms should improve within 6 weeks of treatment. However, recovery time varies. Follow these instructions at home: Managing pain, stiffness, and swelling  If directed, put ice on the injured area during the first 24 hours after your strain.  Put ice in a plastic bag.  Place a towel between your skin and the bag.  Leave the ice on for 20 minutes, 2-3 times a day.  If directed, put heat on the affected area as often as told by your health care provider. Use the heat source that your health care provider recommends, such as a moist heat pack or a heating pad.  Place a towel between your skin and the heat source.  Leave the heat on for 20-30 minutes.  Remove the heat if your skin turns bright red. This is especially important if you are unable to feel pain, heat, or cold. You may have a greater risk of getting burned. Activity  Rest and return to your normal activities as told by your health care provider. Ask your health care provider what activities are safe for you.  Avoid activities that take a lot of energy for as long as told by your health care provider. General instructions  Take over-the-counter and prescription medicines only as told by your health care provider.  Donot drive or use heavy machinery while taking prescription pain medicine.  Do not use any products that contain nicotine or tobacco, such as cigarettes and e-cigarettes. If you need help quitting, ask your health care provider.  Keep all follow-up visits as told by your health care provider. This is important. How is this prevented?  Use correct form when playing sports and lifting heavy objects.  Use good posture when sitting  and standing.  Maintain a healthy weight.  Sleep on a mattress with medium firmness to support your back.  Be safe and responsible while being active to avoid falls.  Do at least 150 minutes of moderate-intensity exercise each week, such as brisk walking or water aerobics. Try a form of exercise that takes stress off your back, such as swimming or stationary cycling.  Maintain physical fitness, including:  Strength.  Flexibility.  Cardiovascular fitness.  Endurance. Contact a health care provider if:  Your back pain does not improve after 6 weeks of treatment.  Your symptoms get worse. Get help right away if:  Your back pain is severe.  You cannot stand or walk.  You have difficulty controlling when you urinate or when you have a bowel movement.  You feel nauseous or you vomit.  Your feet get very cold.  You have numbness, tingling, weakness, or problems using your arms or legs.  You develop any of the following:  Shortness of breath.  Dizziness.  Pain in your legs.  Weakness in your buttocks or legs.  Discoloration of the skin on your toes or legs. This information is not intended to replace advice given to you by your health care provider. Make sure you discuss any questions you have with your health care provider. Document Released: 05/08/2005 Document Revised: 02/16/2016 Document Reviewed: 12/31/2015 Elsevier Interactive Patient Education  2017 [employer].
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
## 2016-11-14 ENCOUNTER — Ambulatory Visit (INDEPENDENT_AMBULATORY_CARE_PROVIDER_SITE_OTHER): Payer: 59 | Admitting: Family Medicine

## 2016-11-14 VITALS — BP 100/64 | HR 104 | Temp 98.4°F | Ht 67.0 in | Wt 125.0 lb

## 2016-11-14 DIAGNOSIS — Z3009 Encounter for other general counseling and advice on contraception: Secondary | ICD-10-CM | POA: Diagnosis not present

## 2016-11-14 MED ORDER — DESOGESTREL-ETHINYL ESTRADIOL 0.15-0.02/0.01 MG (21/5) PO TABS: 1.0000 | ORAL_TABLET | Freq: Every day | ORAL | 1 refills | Status: DC

## 2016-11-14 NOTE — Progress Notes (Signed)
   Subjective:    Patient ID: Jamie Castillo, female    DOB: 02-23-1996, 23 y.o.   MRN: [id number]  HPI 21 year old female here today to follow-up for oral contraceptives. She is wanting to start birth control. She is living with her significant other and they are moving to a new apartment next months.  She wants to discuss options.  She is just worried that she may forget to take the   Review of Systems  BP 100/64   Pulse (!) 104   Temp 98.4 F (36.9 C)   Ht  (1.702 m)   Wt 125 lb (56.7 kg)   SpO2 98%   BMI 19.58 kg/m     No Known Allergies  Past Medical History:  Diagnosis Date  . Anxiety   . Depression     Past Surgical History:  Procedure Laterality Date  . NO PAST SURGERIES      Social History   Social History  . Marital status: Single    Spouse name: N/A  . Number of children: N/A  . Years of education: N/A   Occupational History  . Not on file.   Social History Main Topics  . Smoking status: Never Smoker  . Smokeless tobacco: Never Used  . Alcohol use Yes  . Drug use: No  . Sexual activity: No   Other Topics Concern  . Not on file   Social History Narrative  . No narrative on file    Family History  Problem Relation Age of Onset  . Bipolar disorder Mother   . Hypertension Other   . Depression Other   . Anxiety disorder Father   . Depression Maternal Uncle   . Depression Cousin   . Asthma Brother     Outpatient Encounter Prescriptions as of 11/14/2016  Medication Sig  . desogestrel-ethinyl estradiol (KARIVA) 0.15-0.02/0.01 MG (21/5) tablet Take 1 tablet by mouth daily.  . [DISCONTINUED] cefdinir (OMNICEF) 300 MG capsule Take 1 capsule (300 mg total) by mouth 2 (two) times daily.  . [DISCONTINUED] cyclobenzaprine (FLEXERIL) 5 MG tablet Take 1 tablet (5 mg total) by mouth at bedtime. For muscle relaxant  . [DISCONTINUED] meloxicam (MOBIC) 7.5 MG tablet Take 1 twice a day as needed for pain. Take with food. (Do not take with  any other NSAID.)   No facility-administered encounter medications on file as of 11/14/2016.          Objective:   Physical Exam  Constitutional: She is oriented to person, place, and time. She appears well-developed and well-nourished.  HENT:  Head: Normocephalic and atraumatic.  Cardiovascular: Normal rate, regular rhythm and normal heart sounds.   Pulmonary/Chest: Effort normal and breath sounds normal.  Neurological: She is alert and oriented to person, place, and time.  Skin: Skin is warm and dry.  Psychiatric: She has a normal mood and affect. Her behavior is normal.       Assessment & Plan:  Contraceptive counseling-  Discussed all options and forms.  She would prefer to try the pill. Discussed reminder apps to help her take th3e pilll consistantly. F/U in in 2 months for CPE and pap.  Did discuss all other forms of birth control as well so that she has some options if she decides not to stick with a pill.

## 2017-01-08 ENCOUNTER — Other Ambulatory Visit: Payer: Self-pay | Admitting: Family Medicine

## 2017-02-21 ENCOUNTER — Ambulatory Visit (INDEPENDENT_AMBULATORY_CARE_PROVIDER_SITE_OTHER): Payer: 59 | Admitting: Family Medicine

## 2017-02-21 VITALS — BP 96/66 | HR 109 | Ht 67.0 in | Wt 118.0 lb

## 2017-02-21 DIAGNOSIS — Z3041 Encounter for surveillance of contraceptive pills: Secondary | ICD-10-CM | POA: Diagnosis not present

## 2017-02-21 DIAGNOSIS — F410 Panic disorder [episodic paroxysmal anxiety] without agoraphobia: Secondary | ICD-10-CM | POA: Diagnosis not present

## 2017-02-21 DIAGNOSIS — Z681 Body mass index (BMI) 19 or less, adult: Secondary | ICD-10-CM | POA: Diagnosis not present

## 2017-02-21 DIAGNOSIS — R634 Abnormal weight loss: Secondary | ICD-10-CM

## 2017-02-21 DIAGNOSIS — Z0000 Encounter for general adult medical examination without abnormal findings: Secondary | ICD-10-CM

## 2017-02-21 MED ORDER — DESOGESTREL-ETHINYL ESTRADIOL 0.15-0.02/0.01 MG (21/5) PO TABS: 1.0000 | ORAL_TABLET | Freq: Every day | ORAL | 11 refills | Status: DC
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
## 2017-02-21 NOTE — Patient Instructions (Addendum)

## 2017-02-25 LAB — COMPLETE METABOLIC PANEL WITH GFR
ALT: 22 U/L (ref 6–29)
AST: 21 U/L (ref 10–30)
Albumin: 4.3 g/dL (ref 3.6–5.1)
Alkaline Phosphatase: 10 U/L — ABNORMAL LOW (ref 33–115)
BUN: 13 mg/dL (ref 7–25)
CHLORIDE: 103 mmol/L (ref 98–110)
CO2: 26 mmol/L (ref 20–31)
Calcium: 9.5 mg/dL (ref 8.6–10.2)
Creat: 0.82 mg/dL (ref 0.50–1.10)
GFR, Est African American: >89 mL/min (ref >=60)
GFR, Est Non African American: >89 mL/min (ref >=60)
GLUCOSE: 84 mg/dL (ref 65–99)
POTASSIUM: 3.9 mmol/L (ref 3.5–5.3)
SODIUM: 139 mmol/L (ref 135–146)
Total Bilirubin: 0.6 mg/dL (ref 0.2–1.2)
Total Protein: 7.3 g/dL (ref 6.1–8.1)

## 2017-02-25 LAB — TSH: TSH: 4.4 m[IU]/L

## 2017-02-25 LAB — CBC WITH DIFFERENTIAL/PLATELET

## 2017-02-25 LAB — LIPID PANEL W/REFLEX DIRECT LDL
Cholesterol: 146 mg/dL (ref <200)
HDL: 56 mg/dL (ref >50)
LDL-Cholesterol: 72 mg/dL
NON-HDL CHOLESTEROL (CALC): 90 mg/dL (ref <130)
Total CHOL/HDL Ratio: 2.6 Ratio (ref <5.0)
Triglycerides: 95 mg/dL (ref <150)

## 2017-03-12 ENCOUNTER — Other Ambulatory Visit: Payer: Self-pay | Admitting: Family Medicine

## 2017-03-19 ENCOUNTER — Other Ambulatory Visit: Payer: Self-pay

## 2017-03-19 MED ORDER — DESOGESTREL-ETHINYL ESTRADIOL 0.15-0.02/0.01 MG (21/5) PO TABS: 1.0000 | ORAL_TABLET | Freq: Every day | ORAL | 10 refills | Status: AC

## 2017-03-31 ENCOUNTER — Other Ambulatory Visit (HOSPITAL_COMMUNITY)
Admission: RE | Admit: 2017-03-31 | Discharge: 2017-03-31 | Disposition: A | Discharge status: Home / Self Care | Payer: 59 | Source: Ambulatory Visit | Attending: Family Medicine | Admitting: Family Medicine

## 2017-03-31 ENCOUNTER — Ambulatory Visit (INDEPENDENT_AMBULATORY_CARE_PROVIDER_SITE_OTHER): Payer: 59 | Admitting: Family Medicine

## 2017-03-31 ENCOUNTER — Encounter: Payer: Self-pay | Admitting: Family Medicine

## 2017-03-31 VITALS — BP 116/69 | HR 98 | Wt 121.0 lb

## 2017-03-31 DIAGNOSIS — Z3009 Encounter for other general counseling and advice on contraception: Secondary | ICD-10-CM | POA: Diagnosis not present

## 2017-03-31 DIAGNOSIS — Z124 Encounter for screening for malignant neoplasm of cervix: Secondary | ICD-10-CM | POA: Diagnosis present

## 2017-03-31 DIAGNOSIS — Z681 Body mass index (BMI) 19 or less, adult: Secondary | ICD-10-CM

## 2017-03-31 NOTE — Progress Notes (Signed)
   Subjective:    Patient ID: Jamie Castillo, female    DOB: 1996/02/03, 21 y.o.   MRN: [id number]  HPI 21 year old female comes in today for cervical exam and Pap smear. She has never had one. She attempted intercourse previously but had very much difficulty and had a lot of pain and discomfort with it. She denies any abnormal discharge or vaginal bleeding currently. She's currently on birth control and happy with her current regimen.  Contraceptive counseling-she is happy with her current birth control regimen. She says at some point she may sinusitis changes that she is actually been much more consistent with taking it regularly and so would like to stick with it for now.  Abnormal weight loss-she has actually gained about 3 pounds since I last saw her. Her stress and anxiety levels have been very high and when this happened she tends to not want to eat that she's been using a smart phone application to help her set calorie goals and she says she has been doing better. She like to ultimately get up to about 126 pounds  Review of Systems     Objective:   Physical Exam  Constitutional: She is oriented to person, place, and time. She appears well-developed and well-nourished.  HENT:  Head: Normocephalic and atraumatic.  Eyes: Conjunctivae and EOM are normal.  Cardiovascular: Normal rate.   Pulmonary/Chest: Effort normal.  Abdominal: Hernia confirmed negative in the right inguinal area and confirmed negative in the left inguinal area.  Genitourinary: Vagina normal and uterus normal. There is no rash, tenderness, lesion or injury on the right labia. There is no rash, tenderness, lesion or injury on the left labia. Cervix exhibits no motion tenderness, no discharge and no friability. Right adnexum displays no mass, no tenderness and no fullness. Left adnexum displays no mass, no tenderness and no fullness.  Lymphadenopathy:       Right: No inguinal adenopathy present.       Left: No  inguinal adenopathy present.  Neurological: She is alert and oriented to person, place, and time.  Skin: Skin is dry. No pallor.  Psychiatric: She has a normal mood and affect. Her behavior is normal.  Vitals reviewed.       Assessment & Plan:  Cervical Cancer screening-Pap smear performed. He is the extra small speculum.GC chlamydia testing done. Patient very anxious about the pelvic exam but we were able to get a good specimen. Will call with results once available.  Contraceptive counseling-continue with current birth control pill. We can always consider changing in the future.  Abnormal weight loss/BMI less than 19 -improving. At 3 pounds. Continue to use smart phone app. Follow-up in 3-4 months.

## 2017-04-03 LAB — CYTOLOGY - PAP
Chlamydia: NEGATIVE
Diagnosis: NEGATIVE
HPV: NOT DETECTED
Neisseria Gonorrhea: NEGATIVE

## 2017-04-04 NOTE — Progress Notes (Signed)
Call patient: Your Pap smear is normal. Repeat in 3 years.
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
## 2020-04-21 ENCOUNTER — Other Ambulatory Visit: Payer: Self-pay | Admitting: Emergency Medicine

## 2020-04-21 ENCOUNTER — Encounter (HOSPITAL_COMMUNITY): Payer: Self-pay | Admitting: *Deleted

## 2020-04-21 ENCOUNTER — Other Ambulatory Visit: Payer: Self-pay

## 2020-04-21 ENCOUNTER — Emergency Department (HOSPITAL_COMMUNITY)
Admission: EM | Admit: 2020-04-21 | Discharge: 2020-04-21 | Disposition: A | Discharge status: Home / Self Care | Payer: PRIVATE HEALTH INSURANCE | Attending: Emergency Medicine | Admitting: Emergency Medicine

## 2020-04-21 DIAGNOSIS — R42 Dizziness and giddiness: Secondary | ICD-10-CM | POA: Diagnosis not present

## 2020-04-21 DIAGNOSIS — R55 Syncope and collapse: Secondary | ICD-10-CM | POA: Diagnosis present

## 2020-04-21 DIAGNOSIS — R5383 Other fatigue: Secondary | ICD-10-CM | POA: Insufficient documentation

## 2020-04-21 DIAGNOSIS — Z79899 Other long term (current) drug therapy: Secondary | ICD-10-CM | POA: Insufficient documentation

## 2020-04-21 DIAGNOSIS — E860 Dehydration: Secondary | ICD-10-CM | POA: Diagnosis not present

## 2020-04-21 DIAGNOSIS — R531 Weakness: Secondary | ICD-10-CM | POA: Diagnosis not present

## 2020-04-21 LAB — CBC
HCT: 40.6 % (ref 36.0–46.0)
Hemoglobin: 13.8 g/dL (ref 12.0–15.0)
MCH: 29.2 pg (ref 26.0–34.0)
MCHC: 34 g/dL (ref 30.0–36.0)
MCV: 86 fL (ref 80.0–100.0)
Platelets: 200 10*3/uL (ref 150–400)
RBC: 4.72 MIL/uL (ref 3.87–5.11)
RDW: 12.4 % (ref 11.5–15.5)
WBC: 8.7 10*3/uL (ref 4.0–10.5)
nRBC: 0 % (ref 0.0–0.2)

## 2020-04-21 LAB — URINALYSIS, ROUTINE W REFLEX MICROSCOPIC
Bilirubin Urine: NEGATIVE
Glucose, UA: NEGATIVE mg/dL
Hgb urine dipstick: NEGATIVE
Ketones, ur: 80 mg/dL — AB
Nitrite: NEGATIVE
Protein, ur: 30 mg/dL — AB
Specific Gravity, Urine: 1.03 (ref 1.005–1.030)
pH: 5 (ref 5.0–8.0)

## 2020-04-21 LAB — BASIC METABOLIC PANEL
Anion gap: 12 (ref 5–15)
BUN: 15 mg/dL (ref 6–20)
CO2: 25 mmol/L (ref 22–32)
Calcium: 9.4 mg/dL (ref 8.9–10.3)
Chloride: 101 mmol/L (ref 98–111)
Creatinine, Ser: 0.93 mg/dL (ref 0.44–1.00)
GFR calc Af Amer: >60 mL/min (ref >60)
GFR calc non Af Amer: >60 mL/min (ref >60)
Glucose, Bld: 95 mg/dL (ref 70–99)
Potassium: 3.6 mmol/L (ref 3.5–5.1)
Sodium: 138 mmol/L (ref 135–145)

## 2020-04-21 LAB — I-STAT BETA HCG BLOOD, ED (MC, WL, AP ONLY): I-stat hCG, quantitative: <5 m[IU]/mL (ref <5)

## 2020-04-21 MED ORDER — SODIUM CHLORIDE 0.9 % IV BOLUS
1000.0000 mL | Freq: Once | INTRAVENOUS | Status: AC
  Administered 2020-04-21: 1000 mL via INTRAVENOUS

## 2020-04-21 NOTE — ED Triage Notes (Signed)
Pt arrives via [facility] from work, has not been feeling well for several days. Has been sleeping a lot. Generalized weakness, felt like she was going to pass out. Found her unresponsive on the floor. EMS awake and talking. General body pain. Neck pain from fall, c collar placed. 122/98, hr 100, rr 18, O2 99% RA. CBG 70. Hx of depression, ADHD.  IV established in the left hand.

## 2020-04-21 NOTE — ED Notes (Signed)
Pt refused covid swab; pt states that she is vaccinated and very careful, and does not think that she needs it.

## 2020-04-21 NOTE — ED Provider Notes (Signed)
[facility]-EMERGENCY DEPT Provider Note   CSN: [id number] Arrival date & time: 04/21/20  0041     History Chief Complaint  Patient presents with   Loss of Consciousness    Jamie Castillo is a 24 y.o. female.  Patient to ED after passing out at work tonight. She reports feeling lightheaded and dizzy even before going to work that worsened while at work. She states she has been excessively fatigued recently with long periods of sleep. She reports generalized body aches. She is COVID vaccinated. No fever, cough, congestion, significant nausea or vomiting. She does state she has not been eating lately and that all she had to eat before work over about a 35 hour period was a bowl of cereal. No chest pain, SOB.   The history is provided by the patient. No language interpreter was used.       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    Past Surgical History:  Procedure Laterality Date   NO PAST SURGERIES       OB History    Gravida  0   Para      Term      Preterm      AB      Living        SAB      TAB      Ectopic      Multiple      Live Births              Family History  Problem Relation Age of Onset   Bipolar disorder Mother    Hypertension Other    Depression Other    Anxiety disorder Father    Depression Maternal Uncle    Depression Cousin    Asthma Brother     Social History   Tobacco Use   Smoking status: Never Smoker   Smokeless tobacco: Never Used  Substance Use Topics   Alcohol use: Yes   Drug use: No    Home Medications Prior to Admission medications   Medication Sig Start Date End Date Taking? Authorizing Provider  amphetamine-dextroamphetamine  (ADDERALL) 10 MG tablet Take 10 mg by mouth daily with breakfast.  02/21/20  Yes [provider]  escitalopram (LEXAPRO) 10 MG tablet Take 10 mg by mouth daily. 03/28/20  Yes [provider]  desogestrel-ethinyl estradiol (KARIVA) 0.15-0.02/0.01 MG (21/5) tablet Take 1 tablet by mouth daily. Patient not taking: Reported on 04/21/2020 03/19/17   Agapito Games, MD    Allergies    Glycerin  Review of Systems   Review of Systems  Constitutional: Positive for fatigue. Negative for chills and fever.  HENT: Negative.   Respiratory: Negative.  Negative for shortness of breath.   Cardiovascular: Negative.  Negative for chest pain.  Gastrointestinal: Negative.  Negative for diarrhea and vomiting.  Musculoskeletal: Negative.   Skin: Negative.   Neurological: Positive for dizziness, syncope, weakness and light-headedness.    Physical Exam Updated Vital Signs BP 101/71    Pulse 79    Temp 98.3 F (36.8 C) (Oral)    Resp 18    SpO2 100%   Physical Exam Vitals and nursing note reviewed.  Constitutional:  Appearance: She is well-developed.  HENT:     Head: Normocephalic.     Mouth/Throat:     Mouth: Mucous membranes are dry.  Eyes:     Comments: No conjunctival pallor.  Cardiovascular:     Rate and Rhythm: Normal rate and regular rhythm.  Pulmonary:     Effort: Pulmonary effort is normal.     Breath sounds: Normal breath sounds. No wheezing, rhonchi or rales.  Abdominal:     General: Bowel sounds are normal.     Palpations: Abdomen is soft.     Tenderness: There is no abdominal tenderness. There is no guarding or rebound.  Musculoskeletal:        General: Normal range of motion.     Cervical back: Normal range of motion and neck supple.     Comments: No midline cervical or other spinal tenderness.   Skin:    General: Skin is warm and dry.     Findings: No rash.  Neurological:     Mental Status: She is alert and oriented to person, place, and time.      Sensory: No sensory deficit.     Motor: No weakness.     Coordination: Coordination normal.     ED Results / Procedures / Treatments   Labs (all labs ordered are listed, but only abnormal results are displayed) Labs Reviewed  BASIC METABOLIC PANEL  CBC  URINALYSIS, ROUTINE W REFLEX MICROSCOPIC  I-STAT BETA HCG BLOOD, ED ([facility], [facility], [facility] ONLY)    EKG None  Radiology No results found.  Procedures Procedures (including critical care time)  Medications Ordered in ED Medications  sodium chloride 0.9 % bolus 1,000 mL (has no administration in time range)    ED Course  I have reviewed the triage vital signs and the nursing notes.  Pertinent labs & imaging results that were available during my care of the patient were reviewed by me and considered in my medical decision making (see chart for details).    MDM Rules/Calculators/A&P                          Patient to ED with lightheadedness, dizziness, fatigue for a couple of days, and syncopal episode while at work tonight.   The patient reports not eating regularly and over the last 2 days not drinking enough fluid. She reports being under significant stress and working too many hours.   Labs are reassuring. UA is concentrated with ketones suggesting dehydration. No infection. On orthostatic VS, she becomes tachycardic with position change, minimal dizziness.   IVF's bolused. On re-evaluation, she is feeling better. Will need to repeat orthostatics and/or ambulate after completion of fluids to insure she is stable, however, anticipate discharge home.  Final Clinical Impression(s) / ED Diagnoses Final diagnoses:  None   1. Dehydration 2. Vasovagal syncope  Rx / DC Orders ED Discharge Orders    None       Elpidio Anis, PA-C 04/21/20 7209    Gilda Crease, MD 04/21/20 [phone]

## 2020-04-21 NOTE — ED Notes (Signed)
Patient refuse covid swab

## 2020-04-21 NOTE — ED Notes (Signed)
Signature pad not working; pt verbalized understanding of discharge instructions 

## 2020-04-21 NOTE — ED Provider Notes (Signed)
6:57 AM Signout from [place] PA-C at shift change.   Pt with orthostatic syncope likely 2/2 poor oral intake. Improving with fluids.   Plan: complete 2nd liter IV fluids, recheck orthostatics. Likely d/c.   Labs reviewed. Ketones in urine without glucose. Nml hgb. Neg preg.   EKG: ED ECG REPORT   Date: 04/21/2020  Rate: 86  Rhythm: normal sinus rhythm  QRS Axis: normal  Intervals: normal  ST/T Wave abnormalities: normal  Conduction Disutrbances:none  Narrative Interpretation: QTc 435, no signs of WPW, Brugada, HOCM  Old EKG Reviewed: none available  I have personally reviewed the EKG tracing and agree with the computerized printout as noted.   Orthostatic VS for the past 24 hrs:  BP- Lying Pulse- Lying BP- Sitting Pulse- Sitting BP- Standing at 0 minutes Pulse- Standing at 0 minutes  04/21/20 0654 108/75 86 93/83 95 103/78 101  04/21/20 0351 116/75 90 105/77 100 109/84 119       Renne Crigler, PA-C 04/21/20 [id number]    Gilda Crease, MD 04/21/20 [phone]

## 2020-04-21 NOTE — Discharge Instructions (Signed)
Please drink plenty of fluids and return to a regular diet. Get plenty of rest.   If symptoms persist, please follow up with your doctor for recheck. Return to the ED with any new or worsening symptoms.

## 2020-04-21 NOTE — ED Triage Notes (Signed)
Pt says that she has not been feeling well. She has been sleeping more than usual. SOB with exertion. Pt is groggy in triage, answers questions appropriately.

## 2020-04-21 NOTE — ED Notes (Signed)
Save blue in main lab
